# Patient Record
Sex: Male | Born: 1995 | Race: Black or African American | Hispanic: No | Marital: Single | Smoking: Former smoker
Health system: Southern US, Community
[De-identification: ages and names within clinical notes are randomized; demographics above are authoritative.]

## PROBLEM LIST (undated history)

## (undated) DIAGNOSIS — J020 Streptococcal pharyngitis: Secondary | ICD-10-CM

## (undated) HISTORY — PX: WISDOM TOOTH EXTRACTION: SHX21

---
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
## 2015-07-28 ENCOUNTER — Emergency Department (HOSPITAL_COMMUNITY)
Admission: EM | Admit: 2015-07-28 | Discharge: 2015-07-28 | Disposition: A | Discharge status: Home / Self Care | Payer: Self-pay | Attending: Emergency Medicine | Admitting: Emergency Medicine

## 2015-07-28 ENCOUNTER — Encounter (HOSPITAL_COMMUNITY): Payer: Self-pay | Admitting: Emergency Medicine

## 2015-07-28 ENCOUNTER — Emergency Department (HOSPITAL_COMMUNITY): Payer: Self-pay

## 2015-07-28 DIAGNOSIS — F17210 Nicotine dependence, cigarettes, uncomplicated: Secondary | ICD-10-CM | POA: Insufficient documentation

## 2015-07-28 DIAGNOSIS — Y9289 Other specified places as the place of occurrence of the external cause: Secondary | ICD-10-CM | POA: Insufficient documentation

## 2015-07-28 DIAGNOSIS — W540XXA Bitten by dog, initial encounter: Secondary | ICD-10-CM | POA: Insufficient documentation

## 2015-07-28 DIAGNOSIS — Y998 Other external cause status: Secondary | ICD-10-CM | POA: Insufficient documentation

## 2015-07-28 DIAGNOSIS — Y9389 Activity, other specified: Secondary | ICD-10-CM | POA: Insufficient documentation

## 2015-07-28 DIAGNOSIS — L03011 Cellulitis of right finger: Secondary | ICD-10-CM | POA: Insufficient documentation

## 2015-07-28 DIAGNOSIS — S61250A Open bite of right index finger without damage to nail, initial encounter: Secondary | ICD-10-CM | POA: Insufficient documentation

## 2015-07-28 LAB — CBC
HCT: 43.4 % (ref 39.0–52.0)
Hemoglobin: 15 g/dL (ref 13.0–17.0)
MCH: 30.2 pg (ref 26.0–34.0)
MCHC: 34.6 g/dL (ref 30.0–36.0)
MCV: 87.3 fL (ref 78.0–100.0)
PLATELETS: 236 10*3/uL (ref 150–400)
RBC: 4.97 MIL/uL (ref 4.22–5.81)
RDW: 12.8 % (ref 11.5–15.5)
WBC: 6.7 10*3/uL (ref 4.0–10.5)

## 2015-07-28 LAB — BASIC METABOLIC PANEL
Anion gap: 10 (ref 5–15)
BUN: 24 mg/dL — AB (ref 6–20)
CHLORIDE: 104 mmol/L (ref 101–111)
CO2: 26 mmol/L (ref 22–32)
CREATININE: 1.11 mg/dL (ref 0.61–1.24)
Calcium: 9.5 mg/dL (ref 8.9–10.3)
GFR calc Af Amer: >60 mL/min (ref >60)
GLUCOSE: 94 mg/dL (ref 65–99)
Potassium: 3.7 mmol/L (ref 3.5–5.1)
SODIUM: 140 mmol/L (ref 135–145)

## 2015-07-28 MED ORDER — SODIUM CHLORIDE 0.9 % IV SOLN
3.0000 g | Freq: Four times a day (QID) | INTRAVENOUS | Status: DC
  Administered 2015-07-28: 3 g via INTRAVENOUS
  Filled 2015-07-28: qty 3

## 2015-07-28 MED ORDER — AMOXICILLIN-POT CLAVULANATE 875-125 MG PO TABS: 1.0000 | ORAL_TABLET | Freq: Two times a day (BID) | ORAL | Status: DC

## 2015-07-28 NOTE — ED Notes (Signed)
Bed: [id number] Expected date:  Expected time:  Means of arrival:  Comments: Cleaning and changing

## 2015-07-28 NOTE — ED Provider Notes (Signed)
CSN: [id number]     Arrival date & time 07/28/15  0423 History   First MD Initiated Contact with Patient 07/28/15 0720     Chief Complaint  Patient presents with  . Finger Injury   HPI   William Freeman is a 20 y.o. male with no significant PMH presenting right index finger pain, redness and swelling after being bitten by his puppy on Tuesday. He states he ran his finger under water, applied witch hazel, and wrapped up his finger. His puppy is 2 weeks old and has not had his shots yet. No fevers, chills, HA, abdominal pain, N/V/D.   History reviewed. No pertinent past medical history. Past Surgical History  Procedure Laterality Date  . Wisdom tooth extraction     Family History  Problem Relation Age of Onset  . Hypertension Mother   . Diabetes Father   . Diabetes Other   . Cancer Other    Social History  Substance Use Topics  . Smoking status: Current Some Day Smoker    Types: Cigars  . Smokeless tobacco: None  . Alcohol Use: No    Review of Systems  Ten systems are reviewed and are negative for acute change except as noted in the HPI  Allergies  Review of patient's allergies indicates no known allergies.  Home Medications   Prior to Admission medications   Medication Sig Start Date End Date Taking? Authorizing Provider  acetaminophen (TYLENOL) 500 MG tablet Take 1,000 mg by mouth every 6 (six) hours as needed for mild pain.   Yes Historical Provider, MD  penicillin v potassium (VEETID) 500 MG tablet Take 1 tablet (500 mg total) by mouth 2 (two) times daily. Patient not taking: Reported on 07/28/2015 09/20/14   Todd McVeigh, PA   BP 122/70 mmHg  Pulse 72  Temp(Src) 98.6 F (37 C) (Oral)  Resp 16  Ht 6' (1.829 m)  Wt 88.451 kg  BMI 26.44 kg/m2  SpO2 100% Physical Exam  Constitutional: He appears well-developed and well-nourished. No distress.  HENT:  Head: Normocephalic and atraumatic.  Eyes: Conjunctivae are normal. Right eye exhibits no discharge. Left eye  exhibits no discharge. No scleral icterus.  Neck: No tracheal deviation present.  Cardiovascular: Normal rate, regular rhythm and intact distal pulses.   Pulmonary/Chest: Effort normal. No respiratory distress.  Abdominal: Soft. Bowel sounds are normal. He exhibits no distension.  Musculoskeletal: Normal range of motion. He exhibits edema and tenderness.  Right index finger with full ROM, cellulitis, streaking 1 cm past proximal phalanx. Entry point (PIP) does not appear to be drainable/fluctuant. NVI BL. See photo  Lymphadenopathy:    He has no cervical adenopathy.  Neurological: He is alert. Coordination normal.  Skin: Skin is warm and dry. No rash noted. He is not diaphoretic. No erythema.  Psychiatric: He has a normal mood and affect. His behavior is normal.  Nursing note and vitals reviewed.        ED Course  Procedures  Labs Review Labs Reviewed  BASIC METABOLIC PANEL - Abnormal; Notable for the following:    BUN 24 (*)    All other components within normal limits  CBC   Imaging Review Dg Finger Index Right  07/28/2015  CLINICAL DATA:  Small puncture wound from dog bite to the right index finger 07/25/2015 EXAM: RIGHT INDEX FINGER 2+V COMPARISON:  None. FINDINGS: There is no evidence of fracture or dislocation. There is no evidence of arthropathy or other focal bone abnormality. No opaque foreign body  or fracture. IMPRESSION: Negative. Electronically Signed   By: Marnee SpringJonathon  Watts M.D.   On: 07/28/2015 08:00   I have personally reviewed and evaluated these images and lab results as part of my medical decision-making.  MDM   Final diagnoses:  Dog bite   Patient nontoxic appearing, VSS. Cellulitis s/p dog bite, no area to drain.  BMP, CBC, finger xray unremarkable.  Ordered 3 g Unasyn. Spoke with Dr. Janee Mornhompson who advised ED follow-up on Sunday.  Patient may be safely discharged home with Augmentin BID x 7 days. Discussed reasons for return. Patient to follow-up with ED on  Sunday. Patient in understanding and agreement with the plan.  Melton KrebsSamantha Nicole Kennesha Brewbaker, PA-C 07/28/15 [phone]  Tilden FossaElizabeth Rees, MD 07/28/15 2013

## 2015-07-28 NOTE — Discharge Instructions (Signed)
Mr. William Freeman,  Nice meeting you! [facility]. You may take ibuprofen for your pain. Take all of your antibiotics as prescribed. Return to the emergency department if you develop fevers, chills, the redness in your finger worsens, or you are unable to move/feel your finger. Feel better soon!  S. Lane HackerNicole Linday Rhodes, PA-C

## 2015-07-28 NOTE — ED Notes (Signed)
Pt states he was playing with his puppy on Tuesday and its tooth cut his right index finger  Pt states he cleaned it   Pt states since Tuesday his finger has become red and painful  The puppy is 725 weeks old so it has not had its shots yet

## 2015-08-22 ENCOUNTER — Emergency Department (HOSPITAL_COMMUNITY)
Admission: EM | Admit: 2015-08-22 | Discharge: 2015-08-22 | Disposition: A | Discharge status: Home / Self Care | Payer: MEDICAID | Attending: Emergency Medicine | Admitting: Emergency Medicine

## 2015-08-22 ENCOUNTER — Encounter (HOSPITAL_COMMUNITY): Payer: Self-pay

## 2015-08-22 DIAGNOSIS — R0981 Nasal congestion: Secondary | ICD-10-CM | POA: Insufficient documentation

## 2015-08-22 DIAGNOSIS — J020 Streptococcal pharyngitis: Secondary | ICD-10-CM

## 2015-08-22 DIAGNOSIS — H748X3 Other specified disorders of middle ear and mastoid, bilateral: Secondary | ICD-10-CM | POA: Insufficient documentation

## 2015-08-22 DIAGNOSIS — R0982 Postnasal drip: Secondary | ICD-10-CM | POA: Insufficient documentation

## 2015-08-22 DIAGNOSIS — F17210 Nicotine dependence, cigarettes, uncomplicated: Secondary | ICD-10-CM | POA: Insufficient documentation

## 2015-08-22 LAB — RAPID STREP SCREEN (MED CTR MEBANE ONLY): Streptococcus, Group A Screen (Direct): POSITIVE — AB

## 2015-08-22 MED ORDER — ACETAMINOPHEN 325 MG PO TABS
650.0000 mg | ORAL_TABLET | Freq: Once | ORAL | Status: AC
  Administered 2015-08-22: 650 mg via ORAL
  Filled 2015-08-22: qty 2

## 2015-08-22 MED ORDER — NAPROXEN 250 MG PO TABS: 250.0000 mg | ORAL_TABLET | Freq: Two times a day (BID) | ORAL | Status: DC

## 2015-08-22 MED ORDER — AMOXICILLIN 500 MG PO CAPS: 500.0000 mg | ORAL_CAPSULE | Freq: Three times a day (TID) | ORAL | Status: DC

## 2015-08-22 NOTE — Discharge Instructions (Signed)

## 2015-08-22 NOTE — ED Notes (Signed)
Pt with sore throat and possible fevers at home.  Pt has not checked fever at home.  Pt feels pain in left ear with swallowing.

## 2015-08-22 NOTE — ED Provider Notes (Signed)
CSN: [id number]     Arrival date & time 08/22/15  1126 History  By signing my name below, I, [name], attest that this documentation has been prepared under the direction and in the presence of non-physician practitioner, Everlene FarrierWilliam Nethra Mehlberg, PA-C. Electronically Signed: [name], Scribe. 08/22/2015. 12:31 PM.    Chief Complaint  Patient presents with  . Sore Throat    The history is provided by the patient. No language interpreter was used.     HPI Comments:  William Freeman is a 20 y.o. male who presents to the Emergency Department complaining of sore throat x ~ 8-9 days with moderate constant pain. Pt is able to swallow his secretions but notes increased pain when doing so. He reports associated subjective fever which he last noticed 2 days ago, sneezing, nasal congestion, right ear pain, and postnasal drip. He has taken nyquil with little relief. He denies vomiting, rash, and SOB. Pt also reports sick contacts at home with similar symptoms.   History reviewed. No pertinent past medical history. Past Surgical History  Procedure Laterality Date  . Wisdom tooth extraction     Family History  Problem Relation Age of Onset  . Hypertension Mother   . Diabetes Father   . Diabetes Other   . Cancer Other    Social History  Substance Use Topics  . Smoking status: Current Some Day Smoker    Types: Cigars  . Smokeless tobacco: None  . Alcohol Use: No    Review of Systems  Constitutional: Positive for fever (subjective). Negative for chills.  HENT: Positive for congestion, ear pain, postnasal drip and sore throat. Negative for trouble swallowing and voice change.   Eyes: Negative for visual disturbance.  Respiratory: Negative for shortness of breath.   Cardiovascular: Negative for chest pain.  Gastrointestinal: Negative for vomiting.  Musculoskeletal: Negative for neck pain and neck stiffness.  Skin: Negative for rash.    Allergies  Review of patient's allergies indicates  no known allergies.  Home Medications   Prior to Admission medications   Medication Sig Start Date End Date Taking? Authorizing Provider  acetaminophen (TYLENOL) 500 MG tablet Take 1,000 mg by mouth every 6 (six) hours as needed for mild pain.   Yes Historical Provider, MD  ibuprofen (ADVIL,MOTRIN) 200 MG tablet Take 200 mg by mouth every 6 (six) hours as needed (throat pain.).   Yes Historical Provider, MD  naproxen sodium (ANAPROX) 220 MG tablet Take 220 mg by mouth daily as needed (throat pain.).   Yes Historical Provider, MD  Pseudoeph-Doxylamine-DM-APAP (NYQUIL PO) Take by mouth at bedtime as needed (sore throat.).   Yes Historical Provider, MD  amoxicillin (AMOXIL) 500 MG capsule Take 1 capsule (500 mg total) by mouth 3 (three) times daily. 08/22/15   Everlene FarrierWilliam Lilyahna Sirmon, PA-C  amoxicillin-clavulanate (AUGMENTIN) 875-125 MG tablet Take 1 tablet by mouth every 12 (twelve) hours. Patient not taking: Reported on 08/22/2015 07/28/15   Melton KrebsSamantha Nicole Riley, PA-C  naproxen (NAPROSYN) 250 MG tablet Take 1 tablet (250 mg total) by mouth 2 (two) times daily with a meal. 08/22/15   Everlene FarrierWilliam Timmie Dugue, PA-C   BP 142/83 mmHg  Pulse 97  Temp(Src) 99.2 F (37.3 C) (Oral)  Resp 18  SpO2 100% Physical Exam  Constitutional: He is oriented to person, place, and time. He appears well-developed and well-nourished. No distress.  Nontoxic appearing.  HENT:  Head: Normocephalic and atraumatic.  Right Ear: External ear normal. A middle ear effusion is present.  Left Ear: External  ear normal. A middle ear effusion is present.  Mouth/Throat: Uvula is midline. No trismus in the jaw. No oropharyngeal exudate or tonsillar abscesses.  Mild mddle ear effusion bilaterally No TM erythema or loss of landmarks  Mild bilateral tonsillar hypertrophy without exudate. No trismus, drooling, or peritonsillar abscess  Uvula is midline without edema  Tongue protrusion is nml  Eyes: Conjunctivae are normal. Pupils are equal, round,  and reactive to light. Right eye exhibits no discharge. Left eye exhibits no discharge.  Neck: Normal range of motion. Neck supple. No JVD present.  Cardiovascular: Normal rate, regular rhythm, normal heart sounds and intact distal pulses.   Pulmonary/Chest: Effort normal and breath sounds normal. No stridor. No respiratory distress. He has no wheezes. He has no rales.  Abdominal: Soft. There is no tenderness.  Lymphadenopathy:    He has no cervical adenopathy.  Neurological: He is alert and oriented to person, place, and time.  Skin: Skin is warm and dry. No rash noted. No erythema. No pallor.  Psychiatric: He has a normal mood and affect.  Nursing note and vitals reviewed.   ED Course  Procedures   DIAGNOSTIC STUDIES:  Oxygen Saturation is 100% on RA, normal by my interpretation.    COORDINATION OF CARE:  12:16 PM Discussed treatment plan with pt at bedside and pt agreed to plan.  Labs Review Labs Reviewed  RAPID STREP SCREEN (NOT AT [facility]) - Abnormal; Notable for the following:    Streptococcus, Group A Screen (Direct) POSITIVE (*)    All other components within normal limits    Imaging Review No results found. I have personally reviewed and evaluated these lab results as part of my medical decision-making.  MDM   Meds given in ED:  Medications  acetaminophen (TYLENOL) tablet 650 mg (650 mg Oral Given 08/22/15 1323)    New Prescriptions   AMOXICILLIN (AMOXIL) 500 MG CAPSULE    Take 1 capsule (500 mg total) by mouth 3 (three) times daily.   NAPROXEN (NAPROSYN) 250 MG TABLET    Take 1 tablet (250 mg total) by mouth 2 (two) times daily with a meal.     Final diagnoses:  Strep throat   Pt rapid strep test positive. Pt is tolerating secretions. Presentation not concerning for peritonsillar abscess or spread of infection to deep spaces of the throat; patent airway. Pt will be discharged with Amoxicillin and naproxen.  Specific return precautions discussed. Recommended  PCP follow up. Pt appears safe for discharge. I advised the patient to follow-up with their primary care provider this week. I advised the patient to return to the emergency department with new or worsening symptoms or new concerns. The patient verbalized understanding and agreement with plan.    I personally performed the services described in this documentation, which was scribed in my presence. The recorded information has been reviewed and is accurate.     Everlene Farrier, PA-C 08/22/15 1326  Jerelyn Scott, MD 08/22/15 [phone]

## 2016-02-28 ENCOUNTER — Emergency Department (HOSPITAL_COMMUNITY)
Admission: EM | Admit: 2016-02-28 | Discharge: 2016-02-28 | Disposition: A | Discharge status: Home / Self Care | Payer: No Typology Code available for payment source | Attending: Emergency Medicine | Admitting: Emergency Medicine

## 2016-02-28 ENCOUNTER — Encounter (HOSPITAL_COMMUNITY): Payer: Self-pay

## 2016-02-28 DIAGNOSIS — S80212A Abrasion, left knee, initial encounter: Secondary | ICD-10-CM | POA: Insufficient documentation

## 2016-02-28 DIAGNOSIS — Y929 Unspecified place or not applicable: Secondary | ICD-10-CM | POA: Diagnosis not present

## 2016-02-28 DIAGNOSIS — M79652 Pain in left thigh: Secondary | ICD-10-CM | POA: Diagnosis not present

## 2016-02-28 DIAGNOSIS — S80912A Unspecified superficial injury of left knee, initial encounter: Secondary | ICD-10-CM | POA: Diagnosis present

## 2016-02-28 DIAGNOSIS — F17290 Nicotine dependence, other tobacco product, uncomplicated: Secondary | ICD-10-CM | POA: Diagnosis not present

## 2016-02-28 DIAGNOSIS — Y999 Unspecified external cause status: Secondary | ICD-10-CM | POA: Diagnosis not present

## 2016-02-28 DIAGNOSIS — Y939 Activity, unspecified: Secondary | ICD-10-CM | POA: Diagnosis not present
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
## 2016-02-28 NOTE — ED Notes (Signed)
Declined W/C at D/C and was escorted to lobby by RN. 

## 2016-02-28 NOTE — Discharge Instructions (Signed)
As discussed, your evaluation today has been largely reassuring.  But, it is important that you monitor your condition carefully, and do not hesitate to return to the ED if you develop new, or concerning changes in your condition. ? ?Otherwise, please follow-up with your physician for appropriate ongoing care. ? ?
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
## 2016-03-01 ENCOUNTER — Encounter (HOSPITAL_COMMUNITY): Payer: Self-pay | Admitting: Emergency Medicine

## 2016-03-01 ENCOUNTER — Emergency Department (HOSPITAL_COMMUNITY)
Admission: EM | Admit: 2016-03-01 | Discharge: 2016-03-01 | Disposition: A | Discharge status: Home / Self Care | Payer: BLUE CROSS/BLUE SHIELD | Attending: Emergency Medicine | Admitting: Emergency Medicine

## 2016-03-01 DIAGNOSIS — F17290 Nicotine dependence, other tobacco product, uncomplicated: Secondary | ICD-10-CM | POA: Insufficient documentation

## 2016-03-01 DIAGNOSIS — Z79899 Other long term (current) drug therapy: Secondary | ICD-10-CM | POA: Insufficient documentation

## 2016-03-01 DIAGNOSIS — Z639 Problem related to primary support group, unspecified: Secondary | ICD-10-CM

## 2016-03-01 DIAGNOSIS — R69 Illness, unspecified: Secondary | ICD-10-CM | POA: Insufficient documentation

## 2016-03-01 LAB — COMPREHENSIVE METABOLIC PANEL
ALBUMIN: 4.8 g/dL (ref 3.5–5.0)
ALT: 21 U/L (ref 17–63)
ANION GAP: 6 (ref 5–15)
AST: 26 U/L (ref 15–41)
Alkaline Phosphatase: 89 U/L (ref 38–126)
BILIRUBIN TOTAL: 0.9 mg/dL (ref 0.3–1.2)
BUN: 12 mg/dL (ref 6–20)
CO2: 28 mmol/L (ref 22–32)
Calcium: 9.3 mg/dL (ref 8.9–10.3)
Chloride: 107 mmol/L (ref 101–111)
Creatinine, Ser: 1.18 mg/dL (ref 0.61–1.24)
GFR calc Af Amer: >60 mL/min (ref >60)
GFR calc non Af Amer: >60 mL/min (ref >60)
GLUCOSE: 81 mg/dL (ref 65–99)
POTASSIUM: 3.3 mmol/L — AB (ref 3.5–5.1)
SODIUM: 141 mmol/L (ref 135–145)
TOTAL PROTEIN: 7.9 g/dL (ref 6.5–8.1)

## 2016-03-01 LAB — CBC
HEMATOCRIT: 41.9 % (ref 39.0–52.0)
Hemoglobin: 14.4 g/dL (ref 13.0–17.0)
MCH: 30.4 pg (ref 26.0–34.0)
MCHC: 34.4 g/dL (ref 30.0–36.0)
MCV: 88.4 fL (ref 78.0–100.0)
Platelets: 259 10*3/uL (ref 150–400)
RBC: 4.74 MIL/uL (ref 4.22–5.81)
RDW: 13 % (ref 11.5–15.5)
WBC: 4.8 10*3/uL (ref 4.0–10.5)

## 2016-03-01 LAB — ETHANOL: Alcohol, Ethyl (B): <5 mg/dL (ref <5)

## 2016-03-01 LAB — RAPID URINE DRUG SCREEN, HOSP PERFORMED
Amphetamines: NOT DETECTED
BARBITURATES: NOT DETECTED
BENZODIAZEPINES: NOT DETECTED
COCAINE: NOT DETECTED
Opiates: NOT DETECTED
Tetrahydrocannabinol: NOT DETECTED

## 2016-03-01 LAB — ACETAMINOPHEN LEVEL

## 2016-03-01 LAB — SALICYLATE LEVEL: Salicylate Lvl: <7 mg/dL (ref 2.8–30.0)

## 2016-03-01 MED ORDER — POTASSIUM CHLORIDE CRYS ER 20 MEQ PO TBCR
20.0000 meq | EXTENDED_RELEASE_TABLET | Freq: Once | ORAL | Status: DC
  Filled 2016-03-01: qty 1

## 2016-03-01 NOTE — ED Provider Notes (Signed)
[facility]-EMERGENCY DEPT Provider Note   CSN: [id number] Arrival date & time: 03/01/16  1655     History   Chief Complaint Chief Complaint  Patient presents with  . Suicidal    HPI William BillingsJaylen D Freeman is a 20 y.o. male.  The history is provided by the patient and medical records. No language interpreter was used.   Sharyne RichtersJaylen D Freeman is an otherwise healthy 20 y.o. male  [facility]. Patient states he just felt "overwhelmed with everything going on in his life". He states that he has been dating the same girl for the last 8 years abuse found out that she is pregnant and had been cheating on him. He states that he told her that he was done with her and did not want to live anymore. He states that he was ignoring all of her calls and messages which prompted her to call the police who brought him into the ER today. He states that the officer had a great talk with him and that he no longer wants to hurt himself. He denies homicidal ideations as well as auditory or visual hallucinations. He has no medical problems and denies alcohol or drug use. He has no complaints at this time, but states that his mother suggested he come to the ER for evaluation as suggested by police.  History reviewed. No pertinent past medical history.  There are no active problems to display for this patient.   Past Surgical History:  Procedure Laterality Date  . WISDOM TOOTH EXTRACTION         Home Medications    Prior to Admission medications   Medication Sig Start Date End Date Taking? Authorizing Provider  acetaminophen (TYLENOL) 500 MG tablet Take 1,000 mg by mouth every 6 (six) hours as needed for mild pain.   Yes Historical Provider, MD  ibuprofen (ADVIL,MOTRIN) 200 MG tablet Take 200 mg by mouth every 6 (six) hours as needed (throat pain.).   Yes Historical Provider, MD  naproxen sodium (ANAPROX) 220 MG tablet Take 220 mg by mouth  daily as needed (throat pain.).   Yes Historical Provider, MD    Family History Family History  Problem Relation Age of Onset  . Hypertension Mother   . Diabetes Father   . Diabetes Other   . Cancer Other     Social History Social History  Substance Use Topics  . Smoking status: Current Some Day Smoker    Types: Cigars  . Smokeless tobacco: Not on file  . Alcohol use No     Allergies   Review of patient's allergies indicates no known allergies.   Review of Systems Review of Systems  Constitutional: Negative for fever.  HENT: Negative for congestion.   Eyes: Negative for visual disturbance.  Respiratory: Negative for shortness of breath.   Cardiovascular: Negative for chest pain.  Gastrointestinal: Negative for abdominal pain.  Musculoskeletal: Negative for back pain.  Skin: Negative for rash.  Neurological: Negative for headaches.  Psychiatric/Behavioral: Negative for self-injury.     Physical Exam Updated Vital Signs BP 116/70 (BP Location: Right Arm)   Pulse 74   Temp 98.1 F (36.7 C) (Oral)   Resp 20   SpO2 99%   Physical Exam  Constitutional: He is oriented to person, place, and time. He appears well-developed and well-nourished. No distress.  HENT:  Head: Normocephalic and atraumatic.  Neck: Neck supple. No tracheal deviation present.  Cardiovascular: Normal rate,  regular rhythm and normal heart sounds.   No murmur heard. Pulmonary/Chest: Effort normal and breath sounds normal. No respiratory distress. He has no wheezes. He has no rales.  Musculoskeletal: Normal range of motion.  Neurological: He is alert and oriented to person, place, and time.  Skin: Skin is warm and dry.  Nursing note and vitals reviewed.    ED Treatments / Results  Labs (all labs ordered are listed, but only abnormal results are displayed) Labs Reviewed  COMPREHENSIVE METABOLIC PANEL - Abnormal; Notable for the following:       Result Value   Potassium 3.3 (*)    All  other components within normal limits  ACETAMINOPHEN LEVEL - Abnormal; Notable for the following:    Acetaminophen (Tylenol), Serum <10 (*)    All other components within normal limits  ETHANOL  SALICYLATE LEVEL  CBC  RAPID URINE DRUG SCREEN, HOSP PERFORMED    EKG  EKG Interpretation None       Radiology No results found.  Procedures Procedures (including critical care time)  Medications Ordered in ED Medications  potassium chloride SA (K-DUR,KLOR-CON) CR tablet 20 mEq (not administered)     Initial Impression / Assessment and Plan / ED Course  I have reviewed the triage vital signs and the nursing notes.  Pertinent labs & imaging results that were available during my care of the patient were reviewed by me and considered in my medical decision making (see chart for details).  Clinical Course   William Freeman is a 20 y.o. male who presents to ED for suicidal ideations earlier today. He denies any suicidal ideations or homicidal ideations at present. Labs reviewed. K+ of 3.3 which was replenished in ED. Medically cleared and disposition per TTS.    Final Clinical Impressions(s) / ED Diagnoses   Final diagnoses:  None    New Prescriptions New Prescriptions   No medications on file     [facility] GenevaJaime Pilcher Amritha Yorke, PA-C 03/01/16 1927    Dione Boozeavid Glick, MD 03/01/16 [phone]

## 2016-03-01 NOTE — ED Notes (Signed)
Pt denies SI HI, A/ VH, Pain at this time also denies current depression, or anxiety. Pt excited by information and called his mother for ride. Speaking with MD now.
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
## 2016-08-06 ENCOUNTER — Encounter (HOSPITAL_COMMUNITY): Payer: Self-pay

## 2016-08-06 ENCOUNTER — Emergency Department (HOSPITAL_COMMUNITY): Payer: BLUE CROSS/BLUE SHIELD

## 2016-08-06 ENCOUNTER — Emergency Department (HOSPITAL_COMMUNITY)
Admission: EM | Admit: 2016-08-06 | Discharge: 2016-08-06 | Disposition: A | Discharge status: Home / Self Care | Payer: BLUE CROSS/BLUE SHIELD | Attending: Emergency Medicine | Admitting: Emergency Medicine

## 2016-08-06 DIAGNOSIS — F17290 Nicotine dependence, other tobacco product, uncomplicated: Secondary | ICD-10-CM | POA: Insufficient documentation

## 2016-08-06 DIAGNOSIS — Z79899 Other long term (current) drug therapy: Secondary | ICD-10-CM | POA: Insufficient documentation

## 2016-08-06 DIAGNOSIS — J069 Acute upper respiratory infection, unspecified: Secondary | ICD-10-CM | POA: Insufficient documentation

## 2016-08-06 DIAGNOSIS — B9789 Other viral agents as the cause of diseases classified elsewhere: Secondary | ICD-10-CM

## 2016-08-06 HISTORY — DX: Streptococcal pharyngitis: J02.0

## 2016-08-06 LAB — RAPID STREP SCREEN (MED CTR MEBANE ONLY): Streptococcus, Group A Screen (Direct): NEGATIVE

## 2016-08-06 MED ORDER — PHENOL 1.4 % MT LIQD: 1.0000 | OROMUCOSAL | 0 refills | Status: DC | PRN

## 2016-08-06 MED ORDER — BENZONATATE 100 MG PO CAPS: 100.0000 mg | ORAL_CAPSULE | Freq: Three times a day (TID) | ORAL | 0 refills | Status: DC

## 2016-08-06 NOTE — ED Provider Notes (Signed)
[facility]-EMERGENCY DEPT Provider Note   By signing my name below, I, Earmon Phoenix, attest that this documentation has been prepared under the direction and in the presence of Sharilyn Sites, PA-C. Electronically Signed: Earmon Phoenix, ED Scribe. 08/06/16. 6:40 PM.    History   Chief Complaint Chief Complaint  Patient presents with  . URI   The history is provided by the patient and medical records. No language interpreter was used.    William Freeman is a 21 y.o. male who presents to the Emergency Department complaining of symptoms of a URI that began two days ago. He reports associated rhinorrhea, intermittently productive cough, congestion, sneezing and epistaxis of the right nostril (began today). He states he was traveling in [place] last week and may have come into contact with someone with similar symptoms. He has not taken anything for symptoms. He denies modifying factors. He denies fever, chills, nausea, vomiting.   Past Medical History:  Diagnosis Date  . Strep throat     There are no active problems to display for this patient.   Past Surgical History:  Procedure Laterality Date  . WISDOM TOOTH EXTRACTION         Home Medications    Prior to Admission medications   Medication Sig Start Date End Date Taking? Authorizing Provider  acetaminophen (TYLENOL) 500 MG tablet Take 1,000 mg by mouth every 6 (six) hours as needed for mild pain.    Historical Provider, MD  ibuprofen (ADVIL,MOTRIN) 200 MG tablet Take 200 mg by mouth every 6 (six) hours as needed (throat pain.).    Historical Provider, MD  naproxen sodium (ANAPROX) 220 MG tablet Take 220 mg by mouth daily as needed (throat pain.).    Historical Provider, MD    Family History Family History  Problem Relation Age of Onset  . Hypertension Mother   . Diabetes Father   . Diabetes Other   . Cancer Other     Social History Social History  Substance Use Topics  . Smoking status: Current Some Day  Smoker    Types: Cigars  . Smokeless tobacco: Never Used  . Alcohol use No     Allergies   Patient has no known allergies.   Review of Systems Review of Systems  Constitutional: Negative for chills and fever.  HENT: Positive for congestion, nosebleeds, rhinorrhea and sneezing.   Respiratory: Positive for cough.   Gastrointestinal: Negative for nausea and vomiting.     Physical Exam Updated Vital Signs BP (!) 151/90 (BP Location: Left Arm)   Pulse (!) 112   Temp 98.5 F (36.9 C) (Oral)   Resp 20   Ht 6' (1.829 m)   Wt 229 lb (103.9 kg)   SpO2 100%   BMI 31.06 kg/m   Physical Exam  Constitutional: He is oriented to person, place, and time. He appears well-developed and well-nourished.  HENT:  Head: Normocephalic and atraumatic.  Right Ear: Tympanic membrane and ear canal normal.  Left Ear: Tympanic membrane and ear canal normal.  Nose: Mucosal edema present.  Mouth/Throat: Uvula is midline, oropharynx is clear and moist and mucous membranes are normal.  +nasal congestion, no epistaxis noted  Eyes: Conjunctivae and EOM are normal. Pupils are equal, round, and reactive to light.  Neck: Normal range of motion.  Cardiovascular: Normal rate, regular rhythm and normal heart sounds.   Pulmonary/Chest: Effort normal and breath sounds normal. No respiratory distress. He has no wheezes. He has no rhonchi.  Abdominal: Soft. Bowel sounds  are normal. There is no tenderness. There is no rebound.  Musculoskeletal: Normal range of motion.  Neurological: He is alert and oriented to person, place, and time.  Skin: Skin is warm and dry.  Psychiatric: He has a normal mood and affect.  Nursing note and vitals reviewed.    ED Treatments / Results  DIAGNOSTIC STUDIES: Oxygen Saturation is 100% on RA, normal by my interpretation.   COORDINATION OF CARE: 6:34 PM- Will order CXR. Pt verbalizes understanding and agrees to plan.  Medications - No data to display  Labs (all labs  ordered are listed, but only abnormal results are displayed) Labs Reviewed  RAPID STREP SCREEN (NOT AT [facility])  CULTURE, GROUP A STREP [facility])    EKG  EKG Interpretation None       Radiology Dg Chest 2 View  Result Date: 08/06/2016 CLINICAL DATA:  Runny nose and cough, 2 days duration. EXAM: CHEST  2 VIEW COMPARISON:  None. FINDINGS: Cardiomediastinal silhouette is normal. Lungs are clear. Lung volumes are normal. No effusions. No bone abnormalities. IMPRESSION: Normal chest Electronically Signed   By: Paulina Fusi M.D.   On: 08/06/2016 19:09    Procedures Procedures (including critical care time)  Medications Ordered in ED Medications - No data to display   Initial Impression / Assessment and Plan / ED Course  I have reviewed the triage vital signs and the nursing notes.  Pertinent labs & imaging results that were available during my care of the patient were reviewed by me and considered in my medical decision making (see chart for details).  21 year old male here with URI type symptoms for the past 2 days. Recent travel last week [place], questionable sick contacts in. Here he is afebrile and nontoxic. His exam is benign aside from some nasal congestion. He has no active epistaxis here. Lungs are clear without wheezes or rhonchi. Rapid strep and chest x-ray obtained from triage and are both negative. Suspect viral process. We'll plan to discharge home with supportive care. School note given.  FU with PCP.  Discussed plan with patient, he acknowledged understanding and agreed with plan of care.  Return precautions given for new or worsening symptoms.  Final Clinical Impressions(s) / ED Diagnoses   Final diagnoses:  Viral URI with cough    New Prescriptions New Prescriptions   BENZONATATE (TESSALON) 100 MG CAPSULE    Take 1 capsule (100 mg total) by mouth every 8 (eight) hours.   PHENOL (CHLORASEPTIC) 1.4 % LIQD    Use as directed 1 spray in the mouth or throat as needed  for throat irritation / pain.   I personally performed the services described in this documentation, which was scribed in my presence. The recorded information has been reviewed and is accurate.    Garlon Hatchet, PA-C 08/06/16 1934    Linwood Dibbles, MD 08/07/16 2053

## 2016-08-06 NOTE — ED Triage Notes (Signed)
PT C/O URI SYMPTOMS SINCE Sunday NIGHT. PT C/O COUGH, RUNNY NOSE, SORE THROAT, AND SNEEZING WITH BLOOD STEAKS X2. DENIES FEVER. PT STS HE SPENT THE WEEK IN [place].

## 2016-08-06 NOTE — ED Notes (Signed)
PT DISCHARGED. INSTRUCTIONS AND PRESCRIPTIONS GIVEN. AAOX4. PT IN NO APPARENT DISTRESS. THE OPPORTUNITY TO ASK QUESTIONS WAS PROVIDED. 

## 2016-08-06 NOTE — Discharge Instructions (Signed)
Take the prescribed medication as directed.  Make sure to rest and drink fluids. Follow-up with your primary care doctor. Return to the ED for new or worsening symptoms. 

## 2016-08-09 LAB — CULTURE, GROUP A STREP (THRC)

## 2017-04-23 ENCOUNTER — Encounter (HOSPITAL_COMMUNITY): Payer: Self-pay | Admitting: Emergency Medicine

## 2017-04-23 ENCOUNTER — Emergency Department (HOSPITAL_COMMUNITY)
Admission: EM | Admit: 2017-04-23 | Discharge: 2017-04-24 | Disposition: A | Discharge status: Home / Self Care | Payer: BLUE CROSS/BLUE SHIELD | Attending: Emergency Medicine | Admitting: Emergency Medicine

## 2017-04-23 DIAGNOSIS — R197 Diarrhea, unspecified: Secondary | ICD-10-CM | POA: Insufficient documentation

## 2017-04-23 DIAGNOSIS — Z209 Contact with and (suspected) exposure to unspecified communicable disease: Secondary | ICD-10-CM | POA: Insufficient documentation

## 2017-04-23 DIAGNOSIS — R112 Nausea with vomiting, unspecified: Secondary | ICD-10-CM | POA: Insufficient documentation

## 2017-04-23 DIAGNOSIS — R1084 Generalized abdominal pain: Secondary | ICD-10-CM | POA: Insufficient documentation

## 2017-04-23 DIAGNOSIS — Z87891 Personal history of nicotine dependence: Secondary | ICD-10-CM | POA: Insufficient documentation

## 2017-04-23 DIAGNOSIS — R000 Tachycardia, unspecified: Secondary | ICD-10-CM | POA: Insufficient documentation

## 2017-04-23 LAB — CBC
HCT: 44.9 % (ref 39.0–52.0)
Hemoglobin: 15.5 g/dL (ref 13.0–17.0)
MCH: 30.8 pg (ref 26.0–34.0)
MCHC: 34.5 g/dL (ref 30.0–36.0)
MCV: 89.1 fL (ref 78.0–100.0)
PLATELETS: 251 10*3/uL (ref 150–400)
RBC: 5.04 MIL/uL (ref 4.22–5.81)
RDW: 13.1 % (ref 11.5–15.5)
WBC: 6.4 10*3/uL (ref 4.0–10.5)

## 2017-04-23 LAB — COMPREHENSIVE METABOLIC PANEL
ALK PHOS: 95 U/L (ref 38–126)
ALT: 23 U/L (ref 17–63)
AST: 24 U/L (ref 15–41)
Albumin: 4.4 g/dL (ref 3.5–5.0)
Anion gap: 10 (ref 5–15)
BUN: 15 mg/dL (ref 6–20)
CALCIUM: 8.9 mg/dL (ref 8.9–10.3)
CO2: 23 mmol/L (ref 22–32)
CREATININE: 0.98 mg/dL (ref 0.61–1.24)
Chloride: 104 mmol/L (ref 101–111)
Glucose, Bld: 96 mg/dL (ref 65–99)
Potassium: 3.3 mmol/L — ABNORMAL LOW (ref 3.5–5.1)
Sodium: 137 mmol/L (ref 135–145)
TOTAL PROTEIN: 7.9 g/dL (ref 6.5–8.1)
Total Bilirubin: 1 mg/dL (ref 0.3–1.2)

## 2017-04-23 LAB — URINALYSIS, ROUTINE W REFLEX MICROSCOPIC
Bilirubin Urine: NEGATIVE
Glucose, UA: NEGATIVE mg/dL
HGB URINE DIPSTICK: NEGATIVE
Ketones, ur: 5 mg/dL — AB
LEUKOCYTES UA: NEGATIVE
NITRITE: NEGATIVE
PROTEIN: NEGATIVE mg/dL
Specific Gravity, Urine: 1.031 — ABNORMAL HIGH (ref 1.005–1.030)
pH: 5 (ref 5.0–8.0)

## 2017-04-23 LAB — LIPASE, BLOOD: Lipase: 20 U/L (ref 11–51)

## 2017-04-23 MED ORDER — ONDANSETRON 4 MG PO TBDP
4.0000 mg | ORAL_TABLET | Freq: Once | ORAL | Status: AC | PRN
  Administered 2017-04-23: 4 mg via ORAL
  Filled 2017-04-23: qty 1

## 2017-04-23 MED ORDER — SODIUM CHLORIDE 0.9 % IV BOLUS (SEPSIS)
2000.0000 mL | Freq: Once | INTRAVENOUS | Status: AC
  Administered 2017-04-23: 2000 mL via INTRAVENOUS

## 2017-04-23 MED ORDER — ONDANSETRON HCL 4 MG/2ML IJ SOLN
4.0000 mg | Freq: Once | INTRAMUSCULAR | Status: AC
  Administered 2017-04-23: 4 mg via INTRAVENOUS
  Filled 2017-04-23: qty 2

## 2017-04-23 MED ORDER — POTASSIUM CHLORIDE CRYS ER 20 MEQ PO TBCR
40.0000 meq | EXTENDED_RELEASE_TABLET | Freq: Once | ORAL | Status: AC
  Administered 2017-04-23: 40 meq via ORAL
  Filled 2017-04-23: qty 2

## 2017-04-23 NOTE — ED Provider Notes (Signed)
[facility]-EMERGENCY DEPT Provider Note   CSN: [id number] Arrival date & time: 04/23/17  1630     History   Chief Complaint Chief Complaint  Patient presents with  . Emesis    HPI William Freeman is a 21 y.o. male with a hx of no major medical problems presents to the Emergency Department complaining of gradual, persistent, progressively worsening diarrhea onset 0230 this morning. Pt reports early afternoon he developed nausea and vomiting.  Pt reports 2 episodes of NBNB emesis.  Pt reports his wife is sick with similar symptoms.  Pt reports numerous episodes of watery diarrhea without melena or hematochezia.  Pt reports they ate dinner at a friends house prior to the onset of the symptoms.  Associated symptoms include intermittent generalized abd cramping.  Eating makes his symptoms worse, creating vomiting.  Pt denies fever, chills, neck pain, neck stiffness, chest pain, weakness, dizziness, syncope, dysuria, hematuria.  Pt denies international travel, camping, abd surgeries.      The history is provided by the patient and medical records. No language interpreter was used.    Past Medical History:  Diagnosis Date  . Strep throat     There are no active problems to display for this patient.   Past Surgical History:  Procedure Laterality Date  . WISDOM TOOTH EXTRACTION         Home Medications    Prior to Admission medications   Medication Sig Start Date End Date Taking? Authorizing Provider  acetaminophen (TYLENOL) 500 MG tablet Take 1,000 mg by mouth every 6 (six) hours as needed for mild pain.   Yes [provider]  ibuprofen (ADVIL,MOTRIN) 200 MG tablet Take 200 mg by mouth every 6 (six) hours as needed (throat pain.).   Yes [provider]  ondansetron (ZOFRAN ODT) 8 MG disintegrating tablet 8mg  ODT q4 hours prn nausea 04/24/17   Osmel Dykstra, Dahlia ClientHannah, PA-C    Family History Family History  Problem Relation Age of Onset  .  Hypertension Mother   . Diabetes Father   . Diabetes Other   . Cancer Other     Social History Social History   Tobacco Use  . Smoking status: Former Smoker    Types: Cigars  . Smokeless tobacco: Never Used  Substance Use Topics  . Alcohol use: Yes    Comment: socially  . Drug use: No     Allergies   Patient has no known allergies.   Review of Systems Review of Systems  Constitutional: Negative for appetite change, diaphoresis, fatigue, fever and unexpected weight change.  HENT: Negative for mouth sores.   Eyes: Negative for visual disturbance.  Respiratory: Negative for cough, chest tightness, shortness of breath and wheezing.   Cardiovascular: Negative for chest pain.  Gastrointestinal: Positive for abdominal pain ( Intermittent cramping), diarrhea, nausea and vomiting. Negative for constipation.  Endocrine: Negative for polydipsia, polyphagia and polyuria.  Genitourinary: Negative for dysuria, frequency, hematuria and urgency.  Musculoskeletal: Negative for back pain and neck stiffness.  Skin: Negative for rash.  Allergic/Immunologic: Negative for immunocompromised state.  Neurological: Negative for syncope, light-headedness and headaches.  Hematological: Does not bruise/bleed easily.  Psychiatric/Behavioral: Negative for sleep disturbance. The patient is not nervous/anxious.      Physical Exam Updated Vital Signs BP (!) 143/94 (BP Location: Right Arm)   Pulse (!) 109   Temp 98.5 F (36.9 C)   Resp 18   Ht 6' (1.829 m)   Wt 117.9 kg (260 lb)  SpO2 97%   BMI 35.26 kg/m   Physical Exam  Constitutional: He appears well-developed and well-nourished. No distress.  Awake, alert, nontoxic appearance  HENT:  Head: Normocephalic and atraumatic.  Mouth/Throat: Oropharynx is clear and moist. Mucous membranes are dry. No oropharyngeal exudate.  Eyes: Conjunctivae are normal. No scleral icterus.  Neck: Normal range of motion. Neck supple.  Cardiovascular:  Regular rhythm and intact distal pulses. Tachycardia present.  Pulmonary/Chest: Effort normal and breath sounds normal. No respiratory distress. He has no wheezes.  Equal chest expansion  Abdominal: Soft. Bowel sounds are normal. He exhibits no mass. There is no tenderness. There is no rigidity, no rebound, no guarding and no CVA tenderness.  Musculoskeletal: Normal range of motion. He exhibits no edema.  Neurological: He is alert.  Speech is clear and goal oriented Moves extremities without ataxia  Skin: Skin is warm and dry. He is not diaphoretic.  Psychiatric: He has a normal mood and affect.  Nursing note and vitals reviewed.    ED Treatments / Results  Labs (all labs ordered are listed, but only abnormal results are displayed) Labs Reviewed  COMPREHENSIVE METABOLIC PANEL - Abnormal; Notable for the following components:      Result Value   Potassium 3.3 (*)    All other components within normal limits  URINALYSIS, ROUTINE W REFLEX MICROSCOPIC - Abnormal; Notable for the following components:   Specific Gravity, Urine 1.031 (*)    Ketones, ur 5 (*)    All other components within normal limits  LIPASE, BLOOD  CBC    Procedures Procedures (including critical care time)  Medications Ordered in ED Medications  ondansetron (ZOFRAN-ODT) disintegrating tablet 4 mg (4 mg Oral Given 04/23/17 1728)  sodium chloride 0.9 % bolus 2,000 mL (0 mLs Intravenous Stopped 04/24/17 0038)  ondansetron (ZOFRAN) injection 4 mg (4 mg Intravenous Given 04/23/17 2318)  potassium chloride SA (K-DUR,KLOR-CON) CR tablet 40 mEq (40 mEq Oral Given 04/23/17 2318)  sodium chloride 0.9 % bolus 500 mL (500 mLs Intravenous New Bag/Given 04/24/17 0050)     Initial Impression / Assessment and Plan / ED Course  I have reviewed the triage vital signs and the nursing notes.  Pertinent labs & imaging results that were available during my care of the patient were reviewed by me and considered in my medical  decision making (see chart for details).     Patient with symptoms consistent with gastroenteritis.  Likely viral vs food bourne in nature.  Vitals are stable, no fever.  Tachycardia improved with fluids.  Patient is nontoxic, nonseptic appearing, in no apparent distress.  Patient does not meet the SIRS or Sepsis criteria.  Pt's symptoms have been managed in the department; fluid bolus given.  Mild hypokalemia likely 2/2 diarrhea.  Replacement begun in the ED.  Mild signs of dehydration, tolerating PO fluids > 6 oz.  Lungs are clear.  No focal abdominal pain, no peritoneal signs.  Less likely for appendicitis, cholecystitis, pancreatitis, ruptured viscus, UTI, kidney stone, testicular torsion.  Supportive therapy indicated.  Patient counseled, expresses understanding and agrees with plan.  1:12 AM On repeat exam, abdomen is soft and nontender.  He is tolerating p.o. without difficulty.  Additional 500 mL's given for persistent, low-grade tachycardia.  No chest pain or shortness of breath.  Suspect this is due to his level of dehydration.  He is well-appearing and feels comfortable discharge home.  Discussed reasons to return immediately to the emergency department including fevers, abdominal pain, chest pain, shortness  of breath or other concerns.  Patient states understanding and is in agreement with the plan.  Final Clinical Impressions(s) / ED Diagnoses   Final diagnoses:  Nausea vomiting and diarrhea    ED Discharge Orders        Ordered    ondansetron (ZOFRAN ODT) 8 MG disintegrating tablet     04/24/17 0111       Tonilynn Bieker, Boyd KerbsHannah, PA-C 04/24/17 [id number]    Vanetta MuldersZackowski, Scott, MD 04/24/17 [phone]

## 2017-04-23 NOTE — ED Triage Notes (Signed)
Pt comes in with complaints of emesis associated with possible food poisoning.  First started feeling symptoms around 0230.  Fiance is experiencing symptoms as well after eating same. Ambulatory. A&O x4.

## 2017-04-24 MED ORDER — ONDANSETRON 8 MG PO TBDP: ORAL_TABLET | ORAL | 0 refills | Status: AC

## 2017-04-24 MED ORDER — SODIUM CHLORIDE 0.9 % IV BOLUS (SEPSIS)
500.0000 mL | Freq: Once | INTRAVENOUS | Status: AC
  Administered 2017-04-24: 500 mL via INTRAVENOUS

## 2017-04-24 NOTE — Discharge Instructions (Signed)
1. Medications: zofran, usual home medications °2. Treatment: rest, drink plenty of fluids, advance diet slowly °3. Follow Up: Please followup with your primary doctor in 2 days for discussion of your diagnoses and further evaluation after today's visit; if you do not have a primary care doctor use the resource guide provided to find one; Please return to the ER for persistent vomiting, high fevers or worsening symptoms ° °

## 2017-06-15 ENCOUNTER — Emergency Department (HOSPITAL_COMMUNITY)
Admission: EM | Admit: 2017-06-15 | Discharge: 2017-06-16 | Disposition: A | Discharge status: Home / Self Care | Payer: BLUE CROSS/BLUE SHIELD | Attending: Emergency Medicine | Admitting: Emergency Medicine

## 2017-06-15 ENCOUNTER — Encounter (HOSPITAL_COMMUNITY): Payer: Self-pay | Admitting: Emergency Medicine

## 2017-06-15 ENCOUNTER — Other Ambulatory Visit: Payer: Self-pay

## 2017-06-15 DIAGNOSIS — W19XXXA Unspecified fall, initial encounter: Secondary | ICD-10-CM

## 2017-06-15 DIAGNOSIS — Y9389 Activity, other specified: Secondary | ICD-10-CM | POA: Insufficient documentation

## 2017-06-15 DIAGNOSIS — Z79899 Other long term (current) drug therapy: Secondary | ICD-10-CM | POA: Insufficient documentation

## 2017-06-15 DIAGNOSIS — S99912A Unspecified injury of left ankle, initial encounter: Secondary | ICD-10-CM | POA: Insufficient documentation

## 2017-06-15 DIAGNOSIS — Y990 Civilian activity done for income or pay: Secondary | ICD-10-CM | POA: Insufficient documentation

## 2017-06-15 DIAGNOSIS — W01198A Fall on same level from slipping, tripping and stumbling with subsequent striking against other object, initial encounter: Secondary | ICD-10-CM | POA: Insufficient documentation

## 2017-06-15 DIAGNOSIS — Y9259 Other trade areas as the place of occurrence of the external cause: Secondary | ICD-10-CM | POA: Insufficient documentation

## 2017-06-15 DIAGNOSIS — Z87891 Personal history of nicotine dependence: Secondary | ICD-10-CM | POA: Insufficient documentation

## 2017-06-15 DIAGNOSIS — M25562 Pain in left knee: Secondary | ICD-10-CM

## 2017-06-15 DIAGNOSIS — M25572 Pain in left ankle and joints of left foot: Secondary | ICD-10-CM

## 2017-06-15 NOTE — ED Triage Notes (Signed)
Pt c/o bilat knee pain after reported fall last night while at work onto hands and knees from [employer].  pt states discomfort continued today.

## 2017-06-16 NOTE — Discharge Instructions (Signed)
Please follow-up with orthopedics

## 2017-06-16 NOTE — ED Provider Notes (Signed)
[facility] Provider Note   CSN: [id number] Arrival date & time: 06/15/17  2042     History   Chief Complaint Chief Complaint  Patient presents with  . Fall    HPI William Freeman is a 22 y.o. male who presents with left knee and ankle pain after a mechanical fall last night. No significant PMH. He states he tripped over a pallet. He didn't have significant pain after the accident. When he went home from work he had worsening pain but today the pain has improved. His mom wanted him to get checked. He has been able to walk. He has normal ROM of his knee and ankle.  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 They will sometimes dislocate laterally. This has been happening over the past year but it's becoming more frequent and now occurring several times a month. He's wondering what he can do about this.  HPI  Past Medical History:  Diagnosis Date  . Strep throat     There are no active problems to display for this patient.   Past Surgical History:  Procedure Laterality Date  . WISDOM TOOTH EXTRACTION         Home Medications    Prior to Admission medications   Medication Sig Start Date End Date Taking? Authorizing Provider  acetaminophen (TYLENOL) 500 MG tablet Take 1,000 mg by mouth every 6 (six) hours as needed for mild pain.    [provider]  ibuprofen (ADVIL,MOTRIN) 200 MG tablet Take 200 mg by mouth every 6 (six) hours as needed (throat pain.).    [provider]  ondansetron (ZOFRAN ODT) 8 MG disintegrating tablet 8mg  ODT q4 hours prn nausea 04/24/17   Muthersbaugh, Dahlia ClientHannah, PA-C    Family History Family History  Problem Relation Age of Onset  . Hypertension Mother   . Diabetes Father   . Diabetes Other   . Cancer Other     Social History Social History   Tobacco Use  . Smoking status: Former Smoker    Types: Cigars  . Smokeless tobacco: Never Used    Substance Use Topics  . Alcohol use: Yes    Comment: socially  . Drug use: No     Allergies   Patient has no known allergies.   Review of Systems Review of Systems  Musculoskeletal: Positive for arthralgias. Negative for joint swelling.  Skin: Negative for wound.     Physical Exam Updated Vital Signs BP (!) 131/97 (BP Location: Right Arm)   Pulse 71   Temp 97.9 F (36.6 C) (Oral)   Resp (!) 71   Ht 6' (1.829 m)   Wt 117.9 kg (260 lb)   SpO2 100%   BMI 35.26 kg/m   Physical Exam  Constitutional: He is oriented to person, place, and time. He appears well-developed and well-nourished. No distress.  HENT:  Head: Normocephalic and atraumatic.  Eyes: Conjunctivae are normal. Pupils are equal, round, and reactive to light. Right eye exhibits no discharge. Left eye exhibits no discharge. No scleral icterus.  Neck: Normal range of motion.  Cardiovascular: Normal rate.  Pulmonary/Chest: Effort normal. No respiratory distress.  Abdominal: He exhibits no distension.  Musculoskeletal:  Left knee: No obvious swelling, deformity, or warmth. No tenderness. FROM. 5/5 strength. N/V intact.  Right knee: No obvious swelling, deformity, or warmth. No tenderness. FROM. 5/5 strength. N/V intact.  Left ankle: Normal   Neurological: He is alert and oriented to person,  place, and time.  Skin: Skin is warm and dry.  Psychiatric: He has a normal mood and affect. His behavior is normal.  Nursing note and vitals reviewed.    ED Treatments / Results  Labs (all labs ordered are listed, but only abnormal results are displayed) Labs Reviewed - No data to display  EKG  EKG Interpretation None       Radiology No results found.  Procedures Procedures (including critical care time)  Medications Ordered in ED Medications - No data to display   Initial Impression / Assessment and Plan / ED Course  I have reviewed the triage vital signs and the nursing notes.  Pertinent labs &  imaging results that were available during my care of the patient were reviewed by me and considered in my medical decision making (see chart for details).  22 year old male presents for evaluation after a fall. His exam is normal. His RR was recorded as 71 which is clearly incorrect and was recorded in error. Xrays were deferred. I advised him to f/u with orthopedics regarding his recurrent patellar dislocations.  Final Clinical Impressions(s) / ED Diagnoses   Final diagnoses:  Fall, initial encounter  Acute left ankle pain  Acute pain of left knee    ED Discharge Orders    None       Bethel Born, PA-C 06/16/17 1307    Shon Baton, MD 06/17/17 [phone]

## 2017-06-29 IMAGING — CR DG CHEST 2V
2 series · 2 of 2 positions shown · non-contrast
Comparison: None.

CLINICAL DATA: Runny nose and cough, 2 days duration.

EXAM:
CHEST  2 VIEW

[w chest pa]
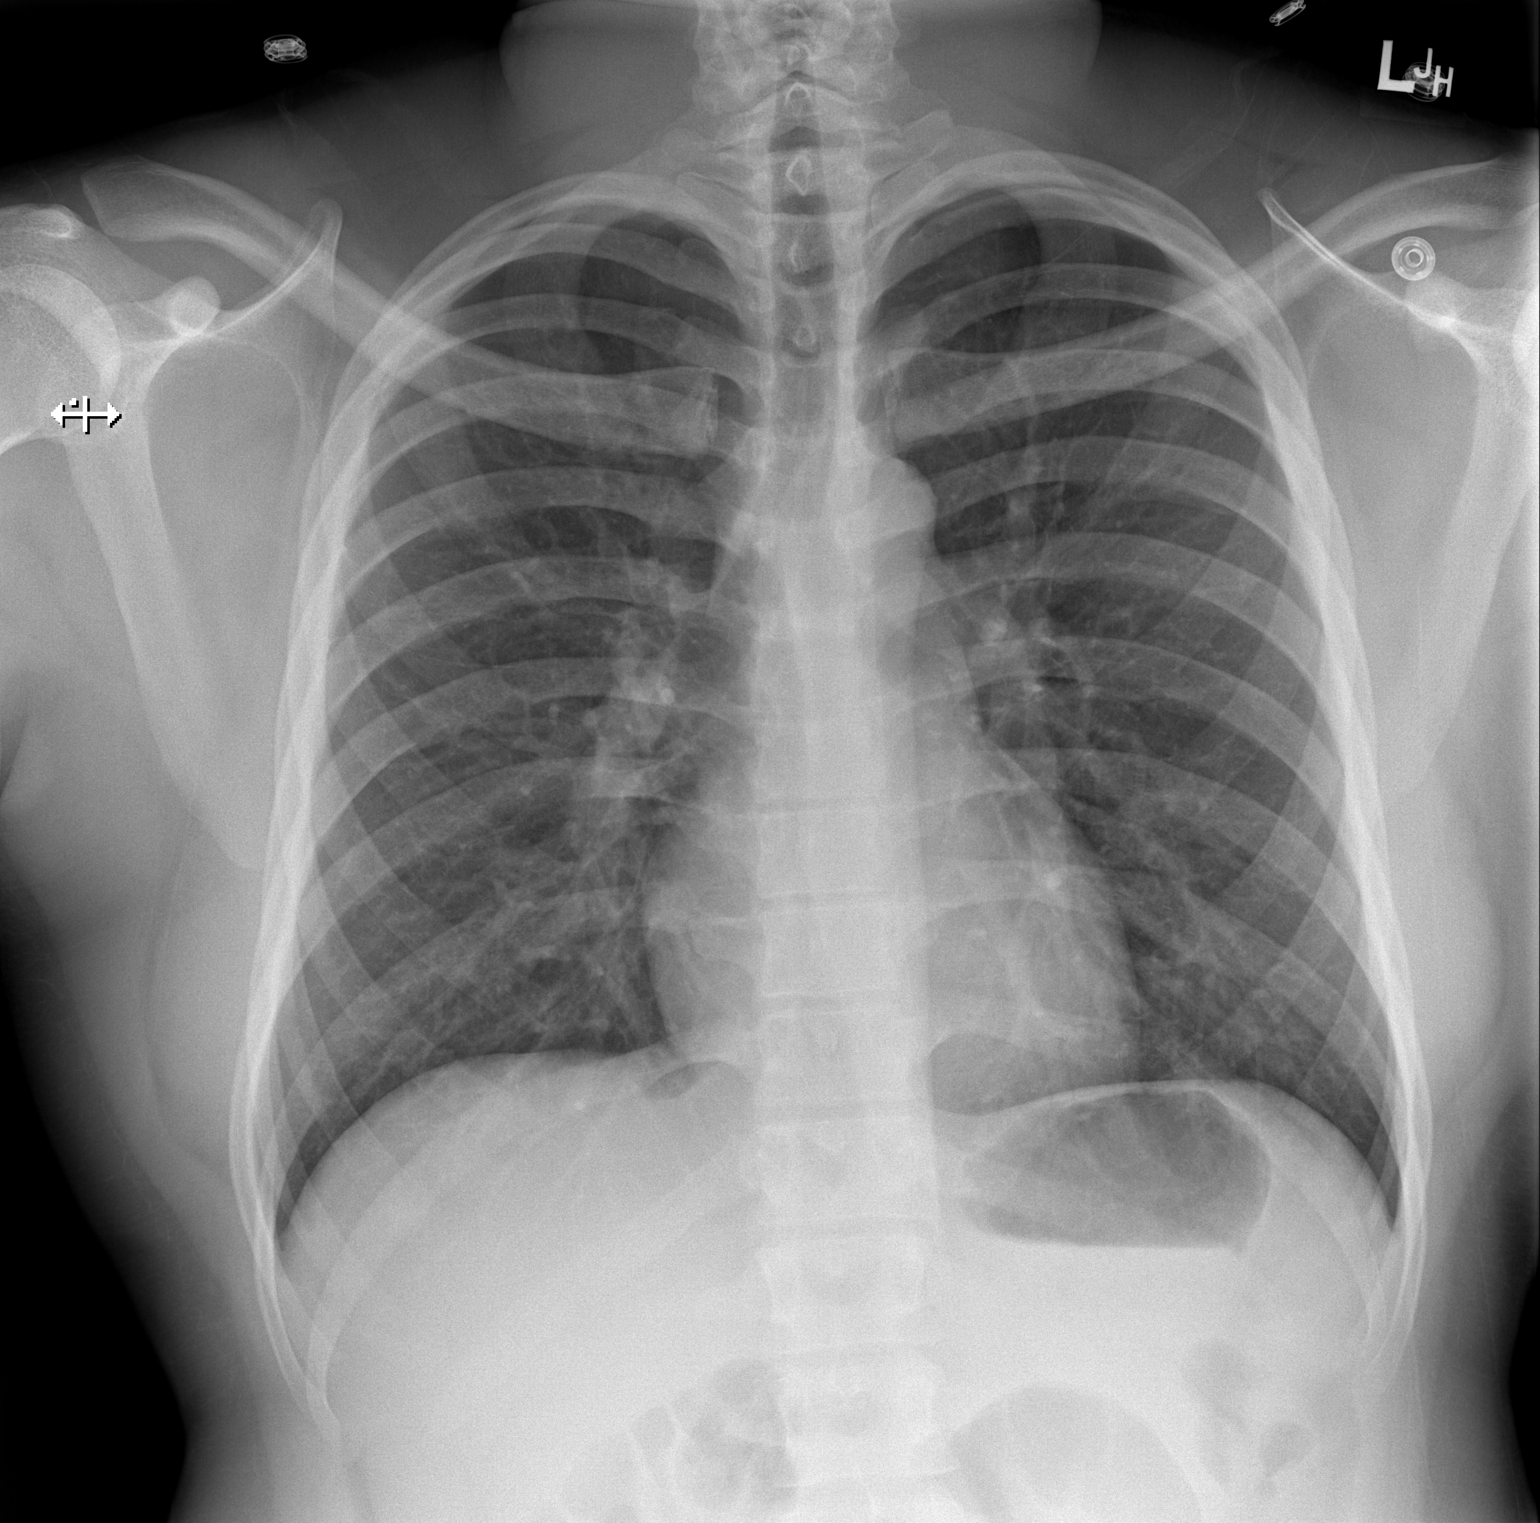

[w chest lat]
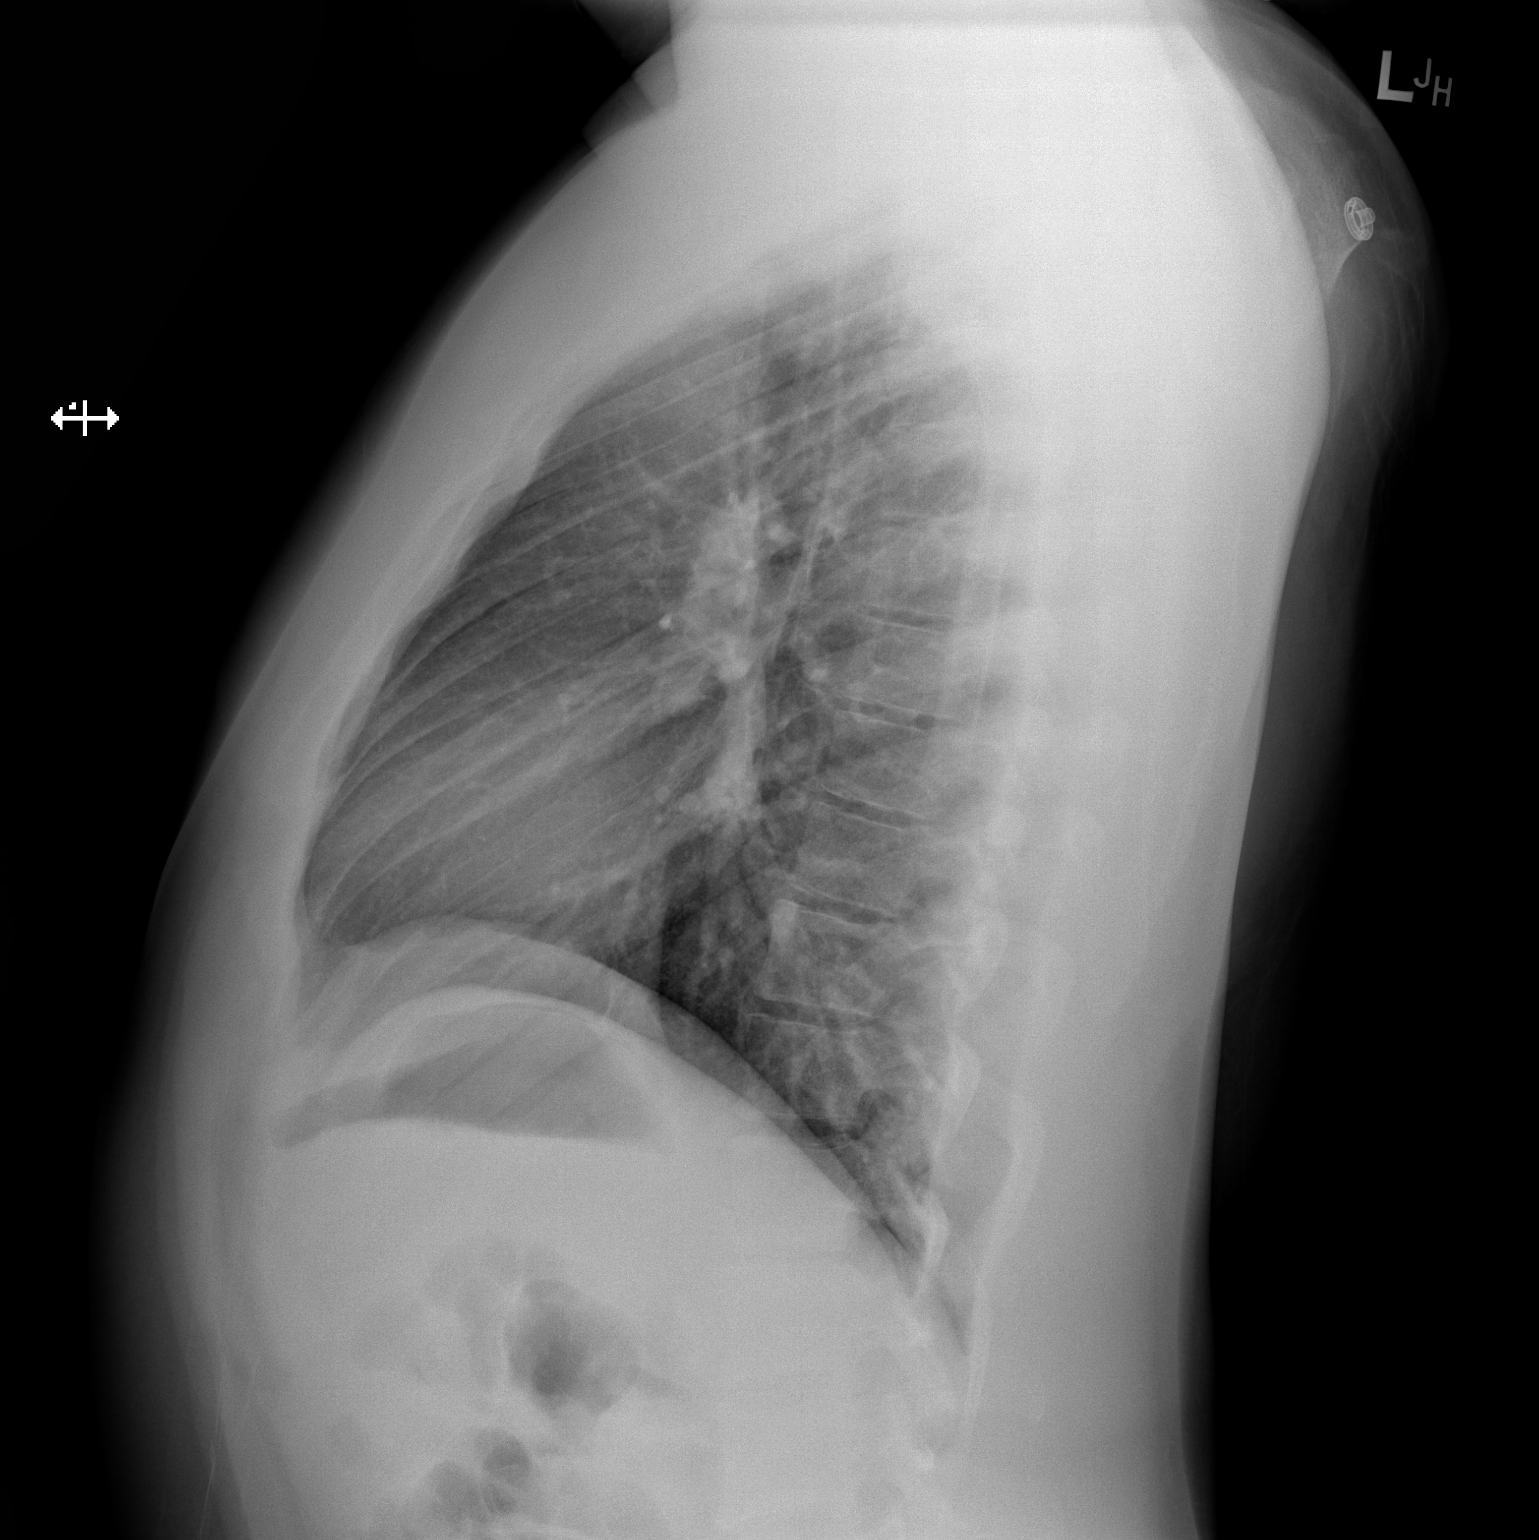

[2 of 2 positions shown; findings below may reference images not displayed]

FINDINGS: Cardiomediastinal silhouette is normal. Lungs are clear. Lung
volumes are normal. No effusions. No bone abnormalities.
IMPRESSION: Normal chest
